# Patient Record
Sex: Female | Born: 2000 | Race: Black or African American | Hispanic: No | Marital: Single | Smoking: Never smoker
Health system: Southern US, Community
[De-identification: ages and names within clinical notes are randomized; demographics above are authoritative.]

## PROBLEM LIST (undated history)

## (undated) DIAGNOSIS — R55 Syncope and collapse: Secondary | ICD-10-CM

## (undated) DIAGNOSIS — F0781 Postconcussional syndrome: Secondary | ICD-10-CM

## (undated) DIAGNOSIS — J45909 Unspecified asthma, uncomplicated: Secondary | ICD-10-CM

## (undated) HISTORY — DX: Syncope and collapse: R55

## (undated) HISTORY — DX: Postconcussional syndrome: F07.81

## (undated) HISTORY — DX: Unspecified asthma, uncomplicated: J45.909

---
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
## 2019-06-25 ENCOUNTER — Telehealth: Payer: Medicaid Other

## 2020-10-12 ENCOUNTER — Encounter: Payer: Self-pay | Admitting: Emergency Medicine

## 2020-10-12 ENCOUNTER — Ambulatory Visit
Admission: EM | Admit: 2020-10-12 | Discharge: 2020-10-12 | Disposition: A | Discharge status: Home / Self Care | Payer: Medicaid Other | Attending: Student | Admitting: Student

## 2020-10-12 ENCOUNTER — Other Ambulatory Visit: Payer: Self-pay

## 2020-10-12 DIAGNOSIS — J029 Acute pharyngitis, unspecified: Secondary | ICD-10-CM

## 2020-10-12 DIAGNOSIS — J4520 Mild intermittent asthma, uncomplicated: Secondary | ICD-10-CM

## 2020-10-12 DIAGNOSIS — Z1152 Encounter for screening for COVID-19: Secondary | ICD-10-CM

## 2020-10-12 DIAGNOSIS — J069 Acute upper respiratory infection, unspecified: Secondary | ICD-10-CM

## 2020-10-12 MED ORDER — LIDOCAINE VISCOUS HCL 2 % MT SOLN: 15.0000 mL | OROMUCOSAL | 0 refills | Status: AC | PRN

## 2020-10-12 MED ORDER — PROMETHAZINE-DM 6.25-15 MG/5ML PO SYRP: 5.0000 mL | ORAL_SOLUTION | Freq: Four times a day (QID) | ORAL | 0 refills | Status: AC | PRN

## 2020-10-12 NOTE — ED Triage Notes (Signed)
Body aches, chills, dry cough. Denies sore throat, fever, NVD. Says her family got sick initially now she believes she may have caught it.

## 2020-10-12 NOTE — Discharge Instructions (Addendum)
-  Promethazine DM cough syrup for congestion/cough. This could make you drowsy, so take at night before bed. -For sore throat, use lidocaine mouthwash up to every 4 hours. Make sure not to eat for at least 1 hour after using this, as your mouth will be very numb and you could bite yourself. -For fevers/chills, bodyaches, headaches- Take Tylenol 1000 mg 3 times daily, and ibuprofen 800 mg 3 times daily with food.  You can take these together, or alternate every 3-4 hours. -Drink plenty of fluids and get plenty of rest -Continue inhaler as needed -With a virus, you're typically contagious for 5-7 days, or as long as you're having fevers.  -Come back and see [place] if symptoms get worse instead of better.

## 2020-10-12 NOTE — ED Provider Notes (Signed)
[facility]    CSN: [id number] Arrival date & time: 10/12/20  1937      History   Chief Complaint Chief Complaint  Patient presents with   Cough    HPI Olivia Singleton is a 20 y.o. female presenting with generalized bodyaches, subjective chills, dry cough x2 days following exposure to mom and sister with same. Asthma well controlled on albuterol prn. Denies n/v/d, shortness of breath, chest pain,  facial pain, teeth pain, headaches, sore throat, loss of taste/smell, swollen lymph nodes, ear pain.      HPI  Past Medical History:  Diagnosis Date   Asthma    Concussion 02/24/2011   Postconcussion syndrome 03/21/2011   CT and MRI both negative except for sinusitis. Treated sinusitis with amoxicillin.  Patient's symptoms slowly improving. Improvement in headaches. Improvement and drowsiness/sleepiness. We'll continue to keep patient from return to play in the setting of continued increase in drowsiness compared to baseline. We'll also restrict patient from testing. 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 No pertinent surgical history.  OB History   No obstetric history on file.      Home Medications    Prior to Admission medications   Medication Sig Start Date End Date Taking? Authorizing Provider  lidocaine (XYLOCAINE) 2 % solution Use as directed 15 mLs in the mouth or throat as needed for mouth pain. 10/12/20  Yes Rhys Martini, PA-C  promethazine-dextromethorphan (PROMETHAZINE-DM) 6.25-15 MG/5ML syrup Take 5 mLs by mouth 4 (four) times daily as needed for cough. 10/12/20  Yes Rhys Martini,  PA-C  albuterol (PROVENTIL HFA;VENTOLIN HFA) 108 (90 BASE) MCG/ACT inhaler Inhale 2 puffs into the lungs every 4 (four) hours as needed. For wheezing or shortness of breath  07/18/10   Kristen Cardinal, MD  amoxicillin-clavulanate (AUGMENTIN) 875-125 MG tablet Take 1 tablet by mouth 2 (two) times daily. 10/19/16   Bing Neighbors, FNP  ipratropium (ATROVENT) 0.03 % nasal spray Place 2 sprays into both nostrils every 12 (twelve) hours. 05/15/13   Presson, Mathis Fare, PA  Norgestimate-Ethinyl Estradiol Triphasic (ORTHO TRI-CYCLEN, 28,) 0.18/0.215/0.25 MG-35 MCG tablet Take 1 tablet by mouth daily. 03/26/17   Moss, Amber, DO  triamcinolone cream (KENALOG) 0.1 % Apply 1 application topically 2 (two) times daily. 08/19/14   Hayden Rasmussen, NP  beclomethasone (QVAR) 40 MCG/ACT inhaler Inhale 2 puffs into the lungs 2 (two) times daily. 10/02/11 08/19/14  Andrena Mews, DO    Family History Family History  Problem Relation Age of Onset   Hypertension Mother    Diabetes Neg Hx    Heart attack Neg Hx    Hyperlipidemia Neg Hx    Sudden death Neg Hx     Social History Social History   Tobacco Use   Smoking status: Never   Smokeless tobacco: Never  Substance Use Topics   Alcohol use: No   Drug use: No     Allergies   Patient has no known allergies.   Review of Systems Review of Systems  Constitutional:  Positive for chills and fatigue. Negative for appetite change and fever.  HENT:  Positive for congestion. Negative for ear pain, rhinorrhea, sinus pressure, sinus pain and sore throat.   Eyes:  Negative for redness and visual disturbance.  Respiratory:  Positive for cough. Negative for chest tightness, shortness of breath and wheezing.   Cardiovascular:  Negative for chest pain and palpitations.  Gastrointestinal:  Negative for abdominal pain, constipation, diarrhea, nausea and vomiting.  Genitourinary:  Negative for dysuria, frequency and urgency.  Musculoskeletal:  Positive for  myalgias.  Neurological:  Negative for dizziness, weakness and headaches.  Psychiatric/Behavioral:  Negative for confusion.   All other systems reviewed and are negative.   Physical Exam Triage Vital Signs ED Triage Vitals [10/12/20 1023]  Enc Vitals Group     BP 118/81     Pulse Rate 97     Resp 14     Temp 99.8 F (37.7 C)     Temp Source Oral     SpO2 98 %     Weight      Height      Head Circumference      Peak Flow      Pain Score 10     Pain Loc      Pain Edu?      Excl. in GC?    No data found.  Updated Vital Signs BP 118/81 (BP Location: Left Arm)   Pulse 97   Temp 99.8 F (37.7 C) (Oral)   Resp 14   LMP 10/01/2020   SpO2 98%   Visual Acuity Right Eye Distance:   Left Eye Distance:   Bilateral Distance:    Right Eye Near:   Left Eye Near:    Bilateral Near:     Physical Exam Vitals reviewed.  Constitutional:      General: She is not in acute distress.    Appearance: Normal appearance. She is ill-appearing.  HENT:     Head: Normocephalic and atraumatic.     Right Ear: Tympanic membrane, ear canal and external ear normal. No tenderness. No middle ear effusion. There is no impacted cerumen. Tympanic membrane is not perforated, erythematous, retracted or bulging.     Left Ear: Tympanic membrane, ear canal and external ear normal. No tenderness.  No middle ear effusion. There is no impacted cerumen. Tympanic membrane is not perforated, erythematous, retracted or bulging.     Nose: Nose normal. No congestion.     Mouth/Throat:     Mouth: Mucous membranes are moist.     Pharynx: Uvula midline. No oropharyngeal exudate or posterior oropharyngeal erythema.  Eyes:     Extraocular Movements: Extraocular movements intact.     Pupils: Pupils are equal, round, and reactive to light.  Cardiovascular:     Rate and Rhythm: Normal rate and regular rhythm.     Heart sounds: Normal heart sounds.  Pulmonary:     Effort: Pulmonary effort is normal.     Breath  sounds: Normal breath sounds. No decreased breath sounds, wheezing, rhonchi or rales.  Abdominal:     Palpations: Abdomen is soft.     Tenderness: There is no abdominal tenderness. There is no guarding or rebound.  Neurological:     General: No focal deficit present.     Mental Status: She is alert and oriented to person, place, and time.  Psychiatric:        Mood and Affect: Mood normal.        Behavior: Behavior normal.        Thought Content: Thought content normal.  Judgment: Judgment normal.     UC Treatments / Results  Labs (all labs ordered are listed, but only abnormal results are displayed) Labs Reviewed  COVID-19, FLU A+B NAA    EKG   Radiology No results found.  Procedures Procedures (including critical care time)  Medications Ordered in UC Medications - No data to display  Initial Impression / Assessment and Plan / UC Course  I have reviewed the triage vital signs and the nursing notes.  Pertinent labs & imaging results that were available during my care of the patient were reviewed by me and considered in my medical decision making (see chart for details).     This patient is a very pleasant 20 y.o. year old female presenting with viral URI with cough. Today this pt is afebrile nontachycardic nontachypneic, oxygenating well on room air, no wheezes rhonchi or rales. Asthma is well controlled on albuterol.   Promethazine, viscous lidocaine.  Centor score 0, rapid strep deferred Covid and influenza testing sent.   School note provided. ED return precautions discussed. Patient verbalizes understanding and agreement.    Final Clinical Impressions(s) / UC Diagnoses   Final diagnoses:  Viral URI  Mild intermittent asthma without complication  Viral pharyngitis  Encounter for screening for COVID-19     Discharge Instructions      -Promethazine DM cough syrup for congestion/cough. This could make you drowsy, so take at night before bed. -For  sore throat, use lidocaine mouthwash up to every 4 hours. Make sure not to eat for at least 1 hour after using this, as your mouth will be very numb and you could bite yourself. -For fevers/chills, bodyaches, headaches- Take Tylenol 1000 mg 3 times daily, and ibuprofen 800 mg 3 times daily with food.  You can take these together, or alternate every 3-4 hours. -Drink plenty of fluids and get plenty of rest -Continue inhaler as needed -With a virus, you're typically contagious for 5-7 days, or as long as you're having fevers.  -Come back and see [place] if symptoms get worse instead of better.     ED Prescriptions     Medication Sig Dispense Auth. Provider   lidocaine (XYLOCAINE) 2 % solution Use as directed 15 mLs in the mouth or throat as needed for mouth pain. 100 mL Rhys Martini, PA-C   promethazine-dextromethorphan (PROMETHAZINE-DM) 6.25-15 MG/5ML syrup Take 5 mLs by mouth 4 (four) times daily as needed for cough. 118 mL Rhys Martini, PA-C      PDMP not reviewed this encounter.   Rhys Martini, PA-C 10/12/20 1103

## 2020-10-14 LAB — COVID-19, FLU A+B NAA
Influenza A, NAA: NOT DETECTED
Influenza B, NAA: NOT DETECTED
SARS-CoV-2, NAA: DETECTED — AB
